# Patient Record
Sex: Female | Born: 1985 | Race: White | Hispanic: No | Marital: Married | Smoking: Never smoker
Health system: Southern US, Community
[De-identification: ages and names within clinical notes are randomized; demographics above are authoritative.]

## PROBLEM LIST (undated history)

## (undated) DIAGNOSIS — F32A Depression, unspecified: Secondary | ICD-10-CM

## (undated) DIAGNOSIS — Q231 Congenital insufficiency of aortic valve: Secondary | ICD-10-CM

## (undated) HISTORY — PX: BICEPS TENDON REPAIR: SHX566

## (undated) HISTORY — PX: ROTATOR CUFF REPAIR: SHX139

---

## 2020-08-29 ENCOUNTER — Observation Stay (HOSPITAL_COMMUNITY)
Admission: EM | Admit: 2020-08-29 | Discharge: 2020-08-30 | Disposition: A | Discharge status: Home / Self Care | Payer: BC Managed Care – PPO | Attending: Internal Medicine | Admitting: Internal Medicine

## 2020-08-29 ENCOUNTER — Encounter (HOSPITAL_COMMUNITY): Payer: Self-pay

## 2020-08-29 ENCOUNTER — Other Ambulatory Visit: Payer: Self-pay

## 2020-08-29 ENCOUNTER — Emergency Department (HOSPITAL_COMMUNITY): Payer: BC Managed Care – PPO

## 2020-08-29 DIAGNOSIS — E039 Hypothyroidism, unspecified: Principal | ICD-10-CM | POA: Diagnosis present

## 2020-08-29 DIAGNOSIS — E669 Obesity, unspecified: Secondary | ICD-10-CM | POA: Diagnosis not present

## 2020-08-29 DIAGNOSIS — R001 Bradycardia, unspecified: Secondary | ICD-10-CM | POA: Diagnosis not present

## 2020-08-29 DIAGNOSIS — Q2381 Bicuspid aortic valve: Secondary | ICD-10-CM

## 2020-08-29 DIAGNOSIS — Z6835 Body mass index (BMI) 35.0-35.9, adult: Secondary | ICD-10-CM | POA: Diagnosis not present

## 2020-08-29 DIAGNOSIS — F32A Depression, unspecified: Secondary | ICD-10-CM | POA: Diagnosis present

## 2020-08-29 DIAGNOSIS — Z20822 Contact with and (suspected) exposure to covid-19: Secondary | ICD-10-CM | POA: Insufficient documentation

## 2020-08-29 DIAGNOSIS — Q231 Congenital insufficiency of aortic valve: Secondary | ICD-10-CM

## 2020-08-29 DIAGNOSIS — R002 Palpitations: Secondary | ICD-10-CM | POA: Diagnosis present

## 2020-08-29 HISTORY — DX: Depression, unspecified: F32.A

## 2020-08-29 HISTORY — DX: Congenital insufficiency of aortic valve: Q23.1

## 2020-08-29 LAB — I-STAT BETA HCG BLOOD, ED (MC, WL, AP ONLY): I-stat hCG, quantitative: <5 m[IU]/mL (ref <5)

## 2020-08-29 LAB — CBC WITH DIFFERENTIAL/PLATELET
Abs Immature Granulocytes: 0.03 10*3/uL (ref 0.00–0.07)
Basophils Absolute: 0 10*3/uL (ref 0.0–0.1)
Basophils Relative: 0 %
Eosinophils Absolute: 0.2 10*3/uL (ref 0.0–0.5)
Eosinophils Relative: 2 %
HCT: 40.3 % (ref 36.0–46.0)
Hemoglobin: 12.8 g/dL (ref 12.0–15.0)
Immature Granulocytes: 0 %
Lymphocytes Relative: 23 %
Lymphs Abs: 2.1 10*3/uL (ref 0.7–4.0)
MCH: 27.2 pg (ref 26.0–34.0)
MCHC: 31.8 g/dL (ref 30.0–36.0)
MCV: 85.6 fL (ref 80.0–100.0)
Monocytes Absolute: 0.7 10*3/uL (ref 0.1–1.0)
Monocytes Relative: 8 %
Neutro Abs: 6.2 10*3/uL (ref 1.7–7.7)
Neutrophils Relative %: 67 %
Platelets: 315 10*3/uL (ref 150–400)
RBC: 4.71 MIL/uL (ref 3.87–5.11)
RDW: 13.2 % (ref 11.5–15.5)
WBC: 9.2 10*3/uL (ref 4.0–10.5)
nRBC: 0 % (ref 0.0–0.2)

## 2020-08-29 LAB — RESP PANEL BY RT-PCR (FLU A&B, COVID) ARPGX2
Influenza A by PCR: NEGATIVE
Influenza B by PCR: NEGATIVE
SARS Coronavirus 2 by RT PCR: NEGATIVE

## 2020-08-29 LAB — BASIC METABOLIC PANEL
Anion gap: 5 (ref 5–15)
BUN: 8 mg/dL (ref 6–20)
CO2: 21 mmol/L — ABNORMAL LOW (ref 22–32)
Calcium: 9 mg/dL (ref 8.9–10.3)
Chloride: 108 mmol/L (ref 98–111)
Creatinine, Ser: 0.8 mg/dL (ref 0.44–1.00)
GFR, Estimated: >60 mL/min (ref >60)
Glucose, Bld: 98 mg/dL (ref 70–99)
Potassium: 3.4 mmol/L — ABNORMAL LOW (ref 3.5–5.1)
Sodium: 134 mmol/L — ABNORMAL LOW (ref 135–145)

## 2020-08-29 LAB — T4, FREE: Free T4: 0.78 ng/dL (ref 0.61–1.12)

## 2020-08-29 LAB — TROPONIN I (HIGH SENSITIVITY)
Troponin I (High Sensitivity): 4 ng/L (ref <18)
Troponin I (High Sensitivity): 3 ng/L (ref <18)

## 2020-08-29 LAB — TSH: TSH: 14.44 u[IU]/mL — ABNORMAL HIGH (ref 0.350–4.500)

## 2020-08-29 MED ORDER — PROMETHAZINE HCL 25 MG PO TABS: 25.0000 mg | ORAL_TABLET | Freq: Four times a day (QID) | ORAL | Status: DC | PRN

## 2020-08-29 MED ORDER — LEVOTHYROXINE SODIUM 25 MCG PO TABS
125.0000 ug | ORAL_TABLET | Freq: Every day | ORAL | Status: DC
  Administered 2020-08-30: 125 ug via ORAL
  Filled 2020-08-29: qty 1

## 2020-08-29 MED ORDER — LEVOTHYROXINE SODIUM 25 MCG PO TABS: 125.0000 ug | ORAL_TABLET | Freq: Every day | ORAL | Status: DC

## 2020-08-29 MED ORDER — FLUTICASONE PROPIONATE 50 MCG/ACT NA SUSP
1.0000 | Freq: Every day | NASAL | Status: DC | PRN
  Filled 2020-08-29: qty 16

## 2020-08-29 MED ORDER — LEVOTHYROXINE SODIUM 25 MCG PO TABS
125.0000 ug | ORAL_TABLET | Freq: Every day | ORAL | Status: DC
  Administered 2020-08-29: 125 ug via ORAL
  Filled 2020-08-29: qty 1

## 2020-08-29 MED ORDER — ENOXAPARIN SODIUM 40 MG/0.4ML IJ SOSY: 40.0000 mg | PREFILLED_SYRINGE | INTRAMUSCULAR | Status: DC

## 2020-08-29 MED ORDER — LACTATED RINGERS IV SOLN: INTRAVENOUS | Status: DC

## 2020-08-29 MED ORDER — TOPIRAMATE 100 MG PO TABS
200.0000 mg | ORAL_TABLET | Freq: Every day | ORAL | Status: DC
  Administered 2020-08-29: 200 mg via ORAL
  Filled 2020-08-29 (×2): qty 2

## 2020-08-29 MED ORDER — TIZANIDINE HCL 4 MG PO TABS
4.0000 mg | ORAL_TABLET | Freq: Three times a day (TID) | ORAL | Status: DC | PRN
  Filled 2020-08-29: qty 1

## 2020-08-29 MED ORDER — SERTRALINE HCL 100 MG PO TABS
200.0000 mg | ORAL_TABLET | Freq: Every day | ORAL | Status: DC
  Administered 2020-08-29: 200 mg via ORAL
  Filled 2020-08-29: qty 2

## 2020-08-29 NOTE — ED Provider Notes (Signed)
I provided a substantive portion of the care of this patient.  I personally performed the entirety of the medical decision making for this encounter.  EKG Interpretation  Date/Time:  Wednesday Aug 29 2020 13:04:28 EDT Ventricular Rate:  64 PR Interval:  164 QRS Duration: 84 QT Interval:  434 QTC Calculation: 447 R Axis:   58 Text Interpretation: Normal sinus rhythm Normal ECG Confirmed by Lorre Nick ([id number]) on 08/29/2020 3:3:79 PM 35 year old female presents with palpitations.  Found to be in sinus bradycardia here.  Also suspicious for hypothyroidism.  Patient has a history of bicuspid valve.  Will consult cardiology   Lorre Nick, MD 08/29/20 [phone]

## 2020-08-29 NOTE — H&P (Addendum)
History and Physical    Bianca Wagner SFK:[id number] DOB: December 08, 1985 DOA: 08/29/2020  PCP: [facility] in [place], [place]  Chief Complaint: Generalized weakness, fatigue, heart palpitations  HPI: Bianca Wagner is a 35 y.o. female with medical history significant of depression, bicuspid aortic valve.  Patient presents from out of town.  The patient lives in [place] with her family.  She travels for her work.  She presented to the urgent care prior to presentation to the emergency department with complaints of heart palpitations since last night.  She checked her blood pressure at home and it was noted to be low and her heart rate was also noted to be low.  She states that she has had fatigue and increasing generalized weakness for the past several weeks.  Occasionally has felt lightheaded.  Denies any chest pain.  No urinary complaints.  No bowel complaints.  At the urgent care her systolic blood pressure was noted to be less than 90.  Her heart rate was also noted to be less than 60.  Here in the emergency department the ED provider did contact the cardiologist on-call, Dr. Herbie Baltimore.  Given the history of bicuspid aortic valve and bradycardia.  Cardiology felt this was likely due to her new onset hypothyroidism.  Did not recommend consult at this time unless clinical status changes or any further cardiac workup at this time.  Labs show sodium of 134, potassium of 3.4, CO2 of 21 and a TSH of 14.  Chest x-ray personally reviewed by me shows no acute cardiopulmonary process.  EKG shows sinus rhythm with a heart rate of 64.  On my evaluation her blood pressure is in the 110s systolic.  Heart rate tween 39 and 50 on bedside monitor upon my evaluation.    Review of Systems: As per HPI; otherwise review of systems reviewed and negative.   Ambulatory Status: Ambulates without assistance  Past Medical History:  Diagnosis Date  . Bicuspid aortic valve   . Depression     Past Surgical  History:  Procedure Laterality Date  . BICEPS TENDON REPAIR    . ROTATOR CUFF REPAIR Right     Social History   Socioeconomic History  . Marital status: Married    Spouse name: Not on file  . Number of children: Not on file  . Years of education: Not on file  . Highest education level: Not on file  Occupational History  . Not on file  Tobacco Use  . Smoking status: Never Smoker  . Smokeless tobacco: Not on file  Substance and Sexual Activity  . Alcohol use: Never  . Drug use: Never  . Sexual activity: Not on file  Other Topics Concern  . Not on file  Social History Narrative  . Not on file   Social Determinants of Health   Financial Resource Strain: Not on file  Food Insecurity: Not on file  Transportation Needs: Not on file  Physical Activity: Not on file  Stress: Not on file  Social Connections: Not on file  Intimate Partner Violence: Not on file    No Known Allergies  Family History  Problem Relation Age of Onset  . Thyroid disease Mother     Prior to Admission medications   Medication Sig Start Date End Date Taking? Authorizing Provider  fluticasone (FLONASE) 50 MCG/ACT nasal spray Place 1 spray into both nostrils daily as needed for allergies or rhinitis.   Yes [provider]  promethazine (PHENERGAN) 25 MG tablet Take 25  mg by mouth 4 (four) times daily as needed for nausea or vomiting. 07/01/20  Yes [provider]  rizatriptan (MAXALT) 10 MG tablet Take 10 mg by mouth daily as needed for migraine. 08/27/20  Yes [provider]  sertraline (ZOLOFT) 100 MG tablet Take 2 tablets by mouth at bedtime. 07/11/20  Yes [provider]  tiZANidine (ZANAFLEX) 4 MG tablet Take 4 mg by mouth 3 (three) times daily as needed (shoulder pain). 08/27/20  Yes [provider]  topiramate (TOPAMAX) 200 MG tablet Take 200 mg by mouth at bedtime. 08/17/20  Yes [provider]    Physical Exam: Vitals:   08/29/20 1745 08/29/20  1800 08/29/20 1815 08/29/20 1820  BP: 113/70 127/86 119/74   Pulse: (!) 45 (!) 42 (!) 40   Resp: 16 15 17    Temp:      SpO2: 100% 100% 100%   Weight:    89.4 kg     . General:  Appears calm and comfortable and is in NAD . Eyes:  PERRL, EOMI, normal lids, iris . ENT:  grossly normal hearing, lips & tongue, mmm; appropriate dentition . Neck:  no LAD, masses or thyromegaly; no carotid bruits . Cardiovascular: irregular rate, regular rhythm, no m/r/g. No LE edema.  Respiratory:   CTA bilaterally with no wheezes/rales/rhonchi.  Normal respiratory effort. . Abdomen:  soft, NT, ND, NABS . Back:   normal alignment, no CVAT . Skin:  no rash or induration seen on limited exam . Musculoskeletal:  grossly normal tone BUE/BLE, good ROM, no bony abnormality . Lower extremity:  No LE edema.  Limited foot exam with no ulcerations.  2+ distal pulses. Marland Kitchen Psychiatric:  grossly normal mood and affect, speech fluent and appropriate, AOx3 . Neurologic:  CN 2-12 grossly intact, moves all extremities in coordinated fashion, sensation intact    Radiological Exams on Admission: Independently reviewed - see discussion in A/P where applicable  DG Chest 2 View  Result Date: 08/29/2020 CLINICAL DATA:  Palpitations.  Weakness. EXAM: CHEST - 2 VIEW COMPARISON:  None. FINDINGS: Heart size is normal. Mediastinal shadows are normal. The lungs are clear. No bronchial thickening. No infiltrate, mass, effusion or collapse. Pulmonary vascularity is normal. No bony abnormality. IMPRESSION: Normal chest Electronically Signed   By: 08/31/2020 M.D.   On: 08/29/2020 13:52    EKG: Independently reviewed.  NSR with rate 64   Labs on Admission: I have personally reviewed the available labs and imaging studies at the time of the admission.  Pertinent labs: TSH 14, sodium 134, potassium 3.4, CO2 21     Assessment:  Symptomatic bradycardia secondary to hypothyroidism New onset hypothyroidism Depression Bicuspid  aortic valve Obesity, BMI 35   Plan: The patient will be admitted to the cardiac telemetry floor under observation status.  Patient will be started on levothyroxine 125 mcg daily.  This is a weight-based dose approximately 1.6 mcg/kg/daily.  Cardiology was consulted by the ED provider and they did not recommend formal consultation at this time.  Her bradycardia is likely associated to her new onset hypothyroidism.  Recommend repeat TSH in 4 to 6 weeks.  We will obtain a free T4 level.  Continue telemetry.  We will start gentle IV fluids with lactated Ringer's at 75 cc an hour.  If clinical status changes can consider cardiology consult at that point.  We will hold off on any atropine at the moment as the patient is not currently hypotensive or altered. Nurse communication order  in place if the patient does become hypotensive to notify physician so that atropine 1mg  IV x1 dose can be potentially administered.   Level of care: Telemetry Cardiac DVT prophylaxis: Lovenox and SCDs Code Status: Full Family Communication: Family present at bedside Disposition Plan:   Home without any needs   MD [facility]   How to contact the [facility] Attending or Consulting provider 7A - 7P or covering provider during after hours 7P -7A, for this patient?  1. Check the care team in [facility] and look for a) attending/consulting [facility] provider listed and b) the [facility] team listed 2. Log into [email or web address] and use [place]'s universal password to access. If you do not have the password, please contact the hospital operator. 3. Locate the [facility] provider you are looking for under [facility] and page to a number that you can be directly reached. 4. If you still have difficulty reaching the provider, please page the [facility] (Director on Call) for the Hospitalists listed on amion for assistance.   08/29/2020, 6:39 PM

## 2020-08-29 NOTE — ED Notes (Signed)
Pt ambulated with steady gait around orange unit. Sats ranged from 92%-96% with HR at 67-77. She reported feeling slightly dizzy but did not need to sit down when asked.

## 2020-08-29 NOTE — ED Notes (Signed)
Report attempted 

## 2020-08-29 NOTE — ED Notes (Signed)
William, PA at bedside. 

## 2020-08-29 NOTE — ED Triage Notes (Signed)
Patient sent from [facility] for further evaluation of palpitations earlier today. Found to have HR less than 60 and BP 90s at [facility]. Patient denies CP, alert and oriented complains of fatigue and just not feeling well.

## 2020-08-29 NOTE — ED Provider Notes (Signed)
Emergency Medicine Provider Triage Evaluation Note  Bianca Wagner , a 35 y.o. female  was evaluated in triage.  Pt complains of palpitations, fatigue, generalized weakness. Seen at urgent care pta and send here for bradycardia. Hx bicuspid aortic valve, here from out of town.   Review of Systems  Positive: Palpitations, fatigue, generalized weakness Negative: Chest pain  Physical Exam  There were no vitals taken for this visit. Gen:   Awake, no distress   Resp:  Normal effort  MSK:   Moves extremities without difficulty  Other:  Sinus brady  Medical Decision Making  Medically screening exam initiated at 1:04 PM.  Appropriate orders placed.  Bianca Wagner was informed that the remainder of the evaluation will be completed by another provider, this initial triage assessment does not replace that evaluation, and the importance of remaining in the ED until their evaluation is complete.   Bianca Wagner 08/29/20 1308    Bianca Lefevre, MD 08/30/20 1725

## 2020-08-29 NOTE — ED Provider Notes (Signed)
[facility] EMERGENCY DEPARTMENT Provider Note   CSN: [id number] Arrival date & time: 08/29/20  1300     History No chief complaint on file.   Bianca Wagner is a 35 y.o. female.  HPI   Patient with significant medical history of aortic bicuspid valve presents to the emergency department with chief complaint of feeling off.  Patient states yesterday she just was not feeling well, states she felt very tired and had heart palpitations which came on suddenly, they lasted throughout the night, she said it took her breath away but she not feel short of breath.  Patient states this morning she had another bout of palpitations which lasted approximately 30 minutes and went away on its own, she denies ever experiencing chest pain, becoming diaphoretic, nausea, vomiting, feeling lightheadedness or dizziness.  Other than the bicuspid valve she has no other cardiac history, no history of PEs or DVTs, currently not on hormone therapy, denies recent trauma, leg swelling, recent surgeries.  Patient does endorse that over the last month or so she has felt more fatigued, tired, has a lack of appetite  denies any sort of skin changes, denies history autoimmune diseases.  Patient states she went to urgent care today where they noted that she was bradycardic and had a systolic blood pressure in the 90s and sent her to the emergency department for further evaluation.  Patient denies headaches, fevers, chills, chest pain, abdominal pain, nausea, vomit, diarrhea, worsening pedal edema.  Past Medical History:  Diagnosis Date  . Bicuspid aortic valve   . Depression     Patient Active Problem List   Diagnosis Date Noted  . Symptomatic bradycardia 08/29/2020  . Hypothyroidism 08/29/2020  . Depression 08/29/2020  . Bicuspid aortic valve 08/29/2020    Past Surgical History:  Procedure Laterality Date  . BICEPS TENDON REPAIR    . ROTATOR CUFF REPAIR Right      OB History   No obstetric  history on file.     Family History  Problem Relation Age of Onset  . Thyroid disease Mother     Social History   Tobacco Use  . Smoking status: Never Smoker  Substance Use Topics  . Alcohol use: Never  . Drug use: Never    Home Medications Prior to Admission medications   Medication Sig Start Date End Date Taking? Authorizing Provider  fluticasone (FLONASE) 50 MCG/ACT nasal spray Place 1 spray into both nostrils daily as needed for allergies or rhinitis.   Yes [provider]  promethazine (PHENERGAN) 25 MG tablet Take 25 mg by mouth 4 (four) times daily as needed for nausea or vomiting. 07/01/20  Yes [provider]  rizatriptan (MAXALT) 10 MG tablet Take 10 mg by mouth daily as needed for migraine. 08/27/20  Yes [provider]  sertraline (ZOLOFT) 100 MG tablet Take 2 tablets by mouth at bedtime. 07/11/20  Yes [provider]  tiZANidine (ZANAFLEX) 4 MG tablet Take 4 mg by mouth 3 (three) times daily as needed (shoulder pain). 08/27/20  Yes [provider]  topiramate (TOPAMAX) 200 MG tablet Take 200 mg by mouth at bedtime. 08/17/20  Yes [provider]    Allergies    Patient has no known allergies.  Review of Systems   Review of Systems  Constitutional: Positive for appetite change and fatigue. Negative for chills and fever.  HENT: Negative for congestion and sore throat.   Respiratory: Negative for shortness of breath.   Cardiovascular: Positive for  palpitations. Negative for chest pain.  Gastrointestinal: Negative for abdominal pain, diarrhea, nausea and vomiting.  Genitourinary: Negative for enuresis.  Musculoskeletal: Negative for back pain.  Skin: Negative for rash.  Neurological: Negative for dizziness and headaches.  Hematological: Does not bruise/bleed easily.    Physical Exam Updated Vital Signs BP 119/74   Pulse (!) 40   Temp 98 F (36.7 C)   Resp 17   Wt 89.4 kg   LMP 08/22/2020   SpO2 100%    Physical Exam Vitals and nursing note reviewed.  Constitutional:      General: She is not in acute distress.    Appearance: She is not ill-appearing.  HENT:     Head: Normocephalic and atraumatic.     Nose: No congestion.  Eyes:     Conjunctiva/sclera: Conjunctivae normal.  Neck:     Comments: Throat was palpated no palpable nodules or enlarged thyroid present my exam. Cardiovascular:     Rate and Rhythm: Regular rhythm. Bradycardia present.     Pulses: Normal pulses.     Heart sounds: No murmur heard. No friction rub. No gallop.   Pulmonary:     Effort: No respiratory distress.     Breath sounds: No wheezing, rhonchi or rales.  Abdominal:     Palpations: Abdomen is soft.     Tenderness: There is no abdominal tenderness.  Musculoskeletal:     Cervical back: No rigidity or tenderness.     Right lower leg: No edema.     Left lower leg: No edema.  Skin:    General: Skin is warm and dry.  Neurological:     Mental Status: She is alert.  Psychiatric:        Mood and Affect: Mood normal.     ED Results / Procedures / Treatments   Labs (all labs ordered are listed, but only abnormal results are displayed) Labs Reviewed  BASIC METABOLIC PANEL - Abnormal; Notable for the following components:      Result Value   Sodium 134 (*)    Potassium 3.4 (*)    CO2 21 (*)    All other components within normal limits  TSH - Abnormal; Notable for the following components:   TSH 14.440 (*)    All other components within normal limits  RESP PANEL BY RT-PCR (FLU A&B, COVID) ARPGX2  CBC WITH DIFFERENTIAL/PLATELET  HIV ANTIBODY (ROUTINE TESTING W REFLEX)  COMPREHENSIVE METABOLIC PANEL  CBC  I-STAT BETA HCG BLOOD, ED ([facility], [facility], [facility] ONLY)  TROPONIN I (HIGH SENSITIVITY)  TROPONIN I (HIGH SENSITIVITY)    EKG EKG Interpretation  Date/Time:  Wednesday Aug 29 2020 13:04:28 EDT Ventricular Rate:  64 PR Interval:  164 QRS Duration: 84 QT Interval:  434 QTC Calculation: 447 R  Axis:   58 Text Interpretation: Normal sinus rhythm Normal ECG Confirmed by Lorre Nick ([id number]) on 08/29/2020 3:19:24 PM   Radiology DG Chest 2 View  Result Date: 08/29/2020 CLINICAL DATA:  Palpitations.  Weakness. EXAM: CHEST - 2 VIEW COMPARISON:  None. FINDINGS: Heart size is normal. Mediastinal shadows are normal. The lungs are clear. No bronchial thickening. No infiltrate, mass, effusion or collapse. Pulmonary vascularity is normal. No bony abnormality. IMPRESSION: Normal chest Electronically Signed   By: Paulina Fusi M.D.   On: 08/29/2020 13:52    Procedures Procedures   Medications Ordered in ED Medications  sertraline (ZOLOFT) tablet 200 mg (has no administration in time range)  tiZANidine (ZANAFLEX) tablet 4 mg (has no administration in  time range)  topiramate (TOPAMAX) tablet 200 mg (has no administration in time range)  fluticasone (FLONASE) 50 MCG/ACT nasal spray 1 spray (has no administration in time range)  promethazine (PHENERGAN) tablet 25 mg (has no administration in time range)  enoxaparin (LOVENOX) injection 40 mg (has no administration in time range)  levothyroxine (SYNTHROID) tablet 125 mcg (has no administration in time range)    ED Course  I have reviewed the triage vital signs and the nursing notes.  Pertinent labs & imaging results that were available during my care of the patient were reviewed by me and considered in my medical decision making (see chart for details).    MDM Rules/Calculators/A&P                         Initial impression-patient presents with not feeling well.  She is alert, does not appear to be in acute distress, vital signs reassuring.  Triage obtained lab work-up.  Work-up-CBC unremarkable, BMP shows slight hyponatremia 134, slight hypokalemia 3.4, slight decrease in CO2 of 21, hCG less than 5, first troponin is 4, TSH 14.4, chest x-ray negative for acute findings, EKG sinus without signs of ischemia.  Orthostatics negative, patient  was ambulated heart rate remained in the 60s to 70s no drop in O2 sats.  Consult    1. due to history of bicuspid aortic valve will consult with cardiology for further recommendations.  Spoke with Dr. Herbie Baltimore of cardiology, he feels the bradycardia is secondary due to the hypothyroidism and from a cardiac standpoint they have no further work-up.  He recommends speaking with endocrinology.  2. Spoke with Dr. Linna Darner of the hospitalist team, he recommends admission for further evaluation.  Reassessment-updated patient on recommendations from cardiology, explained to her that her heart rate is abnormally low and this could be potentially life-threatening.  Recommend that she be admitted for further evaluation.  Patient was agreeable to this plan.  Will speak with hospitalist team for further recommendations.  Rule out-low suspicion for ACS as patient denies chest pain, patient has no cardiac history, EKG is negative for ischemia, patient has a negative delta troponin.  Low suspicion for heart block or arrhythmias as EKG is sinus bradycardia without any other abnormalities.  Low suspicion for systemic infection as patient is nontoxic-appearing, vital signs reassuring, no obvious source of infection present on exam or within lab work.  Plan-admit to medicine due to bradycardia secondary due to hypothyroidism, patient care will be transferred to admitting team.   Final Clinical Impression(s) / ED Diagnoses Final diagnoses:  Hypothyroidism, unspecified type  Bradycardia    Rx / DC Orders ED Discharge Orders    None       Carroll Sage, PA-C 08/29/20 1840    Lorre Nick, MD 08/31/20 1005

## 2020-08-30 ENCOUNTER — Encounter (HOSPITAL_COMMUNITY): Payer: Self-pay | Admitting: Family Medicine

## 2020-08-30 DIAGNOSIS — R001 Bradycardia, unspecified: Secondary | ICD-10-CM | POA: Diagnosis not present

## 2020-08-30 LAB — COMPREHENSIVE METABOLIC PANEL
ALT: 23 U/L (ref 0–44)
AST: 20 U/L (ref 15–41)
Albumin: 3.8 g/dL (ref 3.5–5.0)
Alkaline Phosphatase: 54 U/L (ref 38–126)
Anion gap: 7 (ref 5–15)
BUN: 7 mg/dL (ref 6–20)
CO2: 21 mmol/L — ABNORMAL LOW (ref 22–32)
Calcium: 8.9 mg/dL (ref 8.9–10.3)
Chloride: 111 mmol/L (ref 98–111)
Creatinine, Ser: 0.8 mg/dL (ref 0.44–1.00)
GFR, Estimated: >60 mL/min (ref >60)
Glucose, Bld: 84 mg/dL (ref 70–99)
Potassium: 3.7 mmol/L (ref 3.5–5.1)
Sodium: 139 mmol/L (ref 135–145)
Total Bilirubin: 0.8 mg/dL (ref 0.3–1.2)
Total Protein: 6.5 g/dL (ref 6.5–8.1)

## 2020-08-30 LAB — CBC
HCT: 39.4 % (ref 36.0–46.0)
Hemoglobin: 12.5 g/dL (ref 12.0–15.0)
MCH: 26.8 pg (ref 26.0–34.0)
MCHC: 31.7 g/dL (ref 30.0–36.0)
MCV: 84.5 fL (ref 80.0–100.0)
Platelets: 268 10*3/uL (ref 150–400)
RBC: 4.66 MIL/uL (ref 3.87–5.11)
RDW: 13.2 % (ref 11.5–15.5)
WBC: 6.9 10*3/uL (ref 4.0–10.5)
nRBC: 0 % (ref 0.0–0.2)

## 2020-08-30 LAB — HIV ANTIBODY (ROUTINE TESTING W REFLEX): HIV Screen 4th Generation wRfx: NONREACTIVE

## 2020-08-30 MED ORDER — LEVOTHYROXINE SODIUM 125 MCG PO TABS: 125.0000 ug | ORAL_TABLET | Freq: Every day | ORAL | 0 refills | Status: AC

## 2020-08-30 NOTE — Progress Notes (Signed)
   08/29/20 2330  Assess: if the MEWS score is Yellow or Red  Were vital signs taken at a resting state? Yes  Focused Assessment Change from prior assessment (see assessment flowsheet)  Early Detection of Sepsis Score *See Row Information* Low  MEWS guidelines implemented *See Row Information* Yes  Treat  Pain Scale 0-10  Pain Score 0  Take Vital Signs  Increase Vital Sign Frequency  Yellow: Q 2hr X 2 then Q 4hr X 2, if remains yellow, continue Q 4hrs  Escalate  MEWS: Escalate Yellow: discuss with charge nurse/RN and consider discussing with provider and RRT  Notify: Charge Nurse/RN  Name of Charge Nurse/RN Notified Dana, RN  Date Charge Nurse/RN Notified 08/29/20  Time Charge Nurse/RN Notified 2330  Document  Patient Outcome Other (Comment) (Patient stablized)  Progress note created (see row info) Yes

## 2020-08-30 NOTE — Discharge Instructions (Signed)
Hypothyroidism  Hypothyroidism is when the thyroid gland does not make enough of certain hormones (it is underactive). The thyroid gland is a small gland located in the lower front part of the neck, just in front of the windpipe (trachea). This gland makes hormones that help control how the body uses food for energy (metabolism) as well as how the heart and brain function. These hormones also play a role in keeping your bones strong. When the thyroid is underactive, it produces too little of the hormones thyroxine (T4) and triiodothyronine (T3). What are the causes? This condition may be caused by:  Hashimoto's disease. This is a disease in which the body's disease-fighting system (immune system) attacks the thyroid gland. This is the most common cause.  Viral infections.  Pregnancy.  Certain medicines.  Birth defects.  Past radiation treatments to the head or neck for cancer.  Past treatment with radioactive iodine.  Past exposure to radiation in the environment.  Past surgical removal of part or all of the thyroid.  Problems with a gland in the center of the brain (pituitary gland).  Lack of enough iodine in the diet. What increases the risk? You are more likely to develop this condition if:  You are female.  You have a family history of thyroid conditions.  You use a medicine called lithium.  You take medicines that affect the immune system (immunosuppressants). What are the signs or symptoms? Symptoms of this condition include:  Feeling as though you have no energy (lethargy).  Not being able to tolerate cold.  Weight gain that is not explained by a change in diet or exercise habits.  Lack of appetite.  Dry skin.  Coarse hair.  Menstrual irregularity.  Slowing of thought processes.  Constipation.  Sadness or depression. How is this diagnosed? This condition may be diagnosed based on:  Your symptoms, your medical history, and a physical exam.  Blood  tests. You may also have imaging tests, such as an ultrasound or MRI. How is this treated? This condition is treated with medicine that replaces the thyroid hormones that your body does not make. After you begin treatment, it may take several weeks for symptoms to go away. Follow these instructions at home:  Take over-the-counter and prescription medicines only as told by your health care provider.  If you start taking any new medicines, tell your health care provider.  Keep all follow-up visits as told by your health care provider. This is important. ? As your condition improves, your dosage of thyroid hormone medicine may change. ? You will need to have blood tests regularly so that your health care provider can monitor your condition. Contact a health care provider if:  Your symptoms do not get better with treatment.  You are taking thyroid hormone replacement medicine and you: ? Sweat a lot. ? Have tremors. ? Feel anxious. ? Lose weight rapidly. ? Cannot tolerate heat. ? Have emotional swings. ? Have diarrhea. ? Feel weak. Get help right away if you have:  Chest pain.  An irregular heartbeat.  A rapid heartbeat.  Difficulty breathing. Summary  Hypothyroidism is when the thyroid gland does not make enough of certain hormones (it is underactive).  When the thyroid is underactive, it produces too little of the hormones thyroxine (T4) and triiodothyronine (T3).  The most common cause is Hashimoto's disease, a disease in which the body's disease-fighting system (immune system) attacks the thyroid gland. The condition can also be caused by viral infections, medicine, pregnancy, or   past radiation treatment to the head or neck.  Symptoms may include weight gain, dry skin, constipation, feeling as though you do not have energy, and not being able to tolerate cold.  This condition is treated with medicine to replace the thyroid hormones that your body does not make. This  information is not intended to replace advice given to you by your health care provider. Make sure you discuss any questions you have with your health care provider. Document Revised: 12/23/2019 Document Reviewed: 12/08/2019 Elsevier Patient Education  2021 Elsevier Inc.  

## 2020-08-30 NOTE — Plan of Care (Signed)
  Problem: Education: Goal: Knowledge of General Education information will improve Description: Including pain rating scale, medication(s)/side effects and non-pharmacologic comfort measures 08/30/2020 1036 by Coy Saunas, RN Outcome: Adequate for Discharge 08/30/2020 1036 by Coy Saunas, RN Outcome: Adequate for Discharge   Problem: Health Behavior/Discharge Planning: Goal: Ability to manage health-related needs will improve 08/30/2020 1036 by Coy Saunas, RN Outcome: Adequate for Discharge 08/30/2020 1036 by Coy Saunas, RN Outcome: Adequate for Discharge   Problem: Clinical Measurements: Goal: Ability to maintain clinical measurements within normal limits will improve 08/30/2020 1036 by Coy Saunas, RN Outcome: Adequate for Discharge 08/30/2020 1036 by Coy Saunas, RN Outcome: Adequate for Discharge Goal: Will remain free from infection 08/30/2020 1036 by Coy Saunas, RN Outcome: Adequate for Discharge 08/30/2020 1036 by Coy Saunas, RN Outcome: Adequate for Discharge Goal: Diagnostic test results will improve 08/30/2020 1036 by Coy Saunas, RN Outcome: Adequate for Discharge 08/30/2020 1036 by Coy Saunas, RN Outcome: Adequate for Discharge Goal: Respiratory complications will improve 08/30/2020 1036 by Coy Saunas, RN Outcome: Adequate for Discharge 08/30/2020 1036 by Coy Saunas, RN Outcome: Adequate for Discharge Goal: Cardiovascular complication will be avoided 08/30/2020 1036 by Coy Saunas, RN Outcome: Adequate for Discharge 08/30/2020 1036 by Coy Saunas, RN Outcome: Adequate for Discharge   Problem: Activity: Goal: Risk for activity intolerance will decrease 08/30/2020 1036 by Coy Saunas, RN Outcome: Adequate for Discharge 08/30/2020 1036 by Coy Saunas, RN Outcome: Adequate for Discharge   Problem: Nutrition: Goal: Adequate nutrition will be maintained 08/30/2020 1036 by Coy Saunas, RN Outcome: Adequate for  Discharge 08/30/2020 1036 by Coy Saunas, RN Outcome: Adequate for Discharge   Problem: Coping: Goal: Level of anxiety will decrease 08/30/2020 1036 by Coy Saunas, RN Outcome: Adequate for Discharge 08/30/2020 1036 by Coy Saunas, RN Outcome: Adequate for Discharge   Problem: Elimination: Goal: Will not experience complications related to bowel motility 08/30/2020 1036 by Coy Saunas, RN Outcome: Adequate for Discharge 08/30/2020 1036 by Coy Saunas, RN Outcome: Adequate for Discharge Goal: Will not experience complications related to urinary retention 08/30/2020 1036 by Coy Saunas, RN Outcome: Adequate for Discharge 08/30/2020 1036 by Coy Saunas, RN Outcome: Adequate for Discharge   Problem: Pain Managment: Goal: General experience of comfort will improve 08/30/2020 1036 by Coy Saunas, RN Outcome: Adequate for Discharge 08/30/2020 1036 by Coy Saunas, RN Outcome: Adequate for Discharge   Problem: Safety: Goal: Ability to remain free from injury will improve 08/30/2020 1036 by Coy Saunas, RN Outcome: Adequate for Discharge 08/30/2020 1036 by Coy Saunas, RN Outcome: Adequate for Discharge   Problem: Skin Integrity: Goal: Risk for impaired skin integrity will decrease 08/30/2020 1036 by Coy Saunas, RN Outcome: Adequate for Discharge 08/30/2020 1036 by Coy Saunas, RN Outcome: Adequate for Discharge

## 2020-08-30 NOTE — Discharge Summary (Signed)
Physician Discharge Summary  Bianca Wagner YNW:[id number] DOB: 11/13/1985 DOA: 08/29/2020  PCP: Pcp, No  Admit date: 08/29/2020 Discharge date: 08/30/2020  Admitted From: Home Disposition: Home  Recommendations for Outpatient Follow-up:  1. Follow up with PCP in 4-6 weeks for repeat thyroid function tests 2. Started on levothyroxine 125 mcg p.o. daily for subclinical hypothyroidism  Home Health: No Equipment/Devices: None  Discharge Condition: Stable CODE STATUS: Full code Diet recommendation: Regular diet  History of present illness:  Bianca AlmJessica Wagner is a 35 year old female with past medical history significant for depression, bicuspid aortic valve who initially presented to urgent care for heart palpitations, weakness, fatigue and lightheadedness.  Onset of symptoms over the past several weeks.  During urgent care evaluation, patient was noted to have a heart rate less than 60 with SBP less than 90 and was sent to the emergency department for further evaluation.  Patient denied any chest pain.  No recent initiation of medications, not on a beta-blocker.  Has been on the same depression/migraine medications for many years.  In the ED, temperature 98.0 F, HR 76 (low of 39), RR 18, BP 103/72, SPO2 100% on room air.  Sodium 134, potassium 3.4, chloride 108, CO2 21, glucose 98, BUN 8, creatinine 0.80.  Troponin 4>3.  WBC 9.2, hemoglobin 12.8, platelets 315.  TSH 14.440.  Free T4 0.78.  Influenza A/B PCR negative.  Cova-19 PCR negative.  Chest x-ray with no active cardiopulmonary disease process.  hCG negative.  EKG with sinus bradycardia, rate 64, QTc 447, no dynamic changes or prolonged intervals appreciated.  Case was discussed with cardiology on-call, Dr. Herbie BaltimoreHarding; cardiology felt that her symptoms were related to her new onset hypothyroidism and did not recommend any further cardiac work-up at this time.  [facility] consulted for further evaluation and management of new onset  hypothyroidism.   Hospital course:  Subclinical hypothyroidism Symptomatic bradycardia secondary to hypothyroidism Patient presenting to the ED with progressive weakness, palpitations, fatigue and lightheadedness over the last several weeks.  Patient was noted to have a low heart rate at urgent care.  Patient was noted to have a elevated TSH of 14.440 with a borderline low free T4 of 0.78.  Case was discussed with cardiology, Dr. Herbie BaltimoreHarding by EDP who did not recommend any further cardiac testing/evaluation as her symptoms related to hypothyroidism.  Patient is not on a beta-blocker outpatient.  Patient is on Topamax and sertraline outpatient, which she has been on for many years without any symptoms.  Patient was started on levothyroxine 125 mcg p.o. daily.  Will need repeat TFTs in 4-6 weeks.  Depression Migraine headaches Continue sertraline and Topamax  Bicuspid aortic valve Patient needs to establish care with cardiology in her hometown of [place].  Obesity Body mass index is 35.38 kg/m.  Discussed with patient needs for aggressive lifestyle changes/weight loss as this complicates all facets of care.  Outpatient follow-up with PCP.     Discharge Diagnoses:  Active Problems:   Hypothyroidism   Depression   Bicuspid aortic valve    Discharge Instructions  Discharge Instructions    Call MD for:  difficulty breathing, headache or visual disturbances   Complete by: As directed    Call MD for:  extreme fatigue   Complete by: As directed    Call MD for:  persistant dizziness or light-headedness   Complete by: As directed    Call MD for:  persistant nausea and vomiting   Complete by: As directed    Call MD for:  severe uncontrolled pain   Complete by: As directed    Call MD for:  temperature >100.4   Complete by: As directed    Diet - low sodium heart healthy   Complete by: As directed    Increase activity slowly   Complete by: As directed      Allergies as of 08/30/2020    No Known Allergies     Medication List    TAKE these medications   fluticasone 50 MCG/ACT nasal spray Commonly known as: FLONASE Place 1 spray into both nostrils daily as needed for allergies or rhinitis.   levothyroxine 125 MCG tablet Commonly known as: SYNTHROID Take 1 tablet (125 mcg total) by mouth daily at 6 (six) AM. Start taking on: Aug 31, 2020   promethazine 25 MG tablet Commonly known as: PHENERGAN Take 25 mg by mouth 4 (four) times daily as needed for nausea or vomiting.   rizatriptan 10 MG tablet Commonly known as: MAXALT Take 10 mg by mouth daily as needed for migraine.   sertraline 100 MG tablet Commonly known as: ZOLOFT Take 2 tablets by mouth at bedtime.   tiZANidine 4 MG tablet Commonly known as: ZANAFLEX Take 4 mg by mouth 3 (three) times daily as needed (shoulder pain).   topiramate 200 MG tablet Commonly known as: TOPAMAX Take 200 mg by mouth at bedtime.       Follow-up Information    Primary care physician. Schedule an appointment as soon as possible for a visit in 4 week(s).   Why: Needs appointment in 4-6 weeks for repeat thyroid function test             No Known Allergies  Consultations:  EDP discussed case with cardiology on-call, Dr. Herbie Baltimore   Procedures/Studies: DG Chest 2 View  Result Date: 08/29/2020 CLINICAL DATA:  Palpitations.  Weakness. EXAM: CHEST - 2 VIEW COMPARISON:  None. FINDINGS: Heart size is normal. Mediastinal shadows are normal. The lungs are clear. No bronchial thickening. No infiltrate, mass, effusion or collapse. Pulmonary vascularity is normal. No bony abnormality. IMPRESSION: Normal chest Electronically Signed   By: Paulina Fusi M.D.   On: 08/29/2020 13:52      Subjective: Patient seen and examined at bedside, resting comfortably.  Fatigue slightly improved today.  Heart rate has now normalized.  Started on levothyroxine overnight.  No other questions or concerns at this time.  Ready for discharge home.   Discussed need to establish care with a cardiologist outpatient in regards to her bicuspid aortic valve as well as follow-up PCP in 4-6 weeks for repeat TFTs.  Specifically denies headache, no dizziness, no chest pain, no palpitations currently, no shortness of breath, no abdominal pain, no fever/chills/night sweats, no nausea/vomiting/diarrhea.  No acute events overnight per nursing staff.  Discharge Exam: Vitals:   08/30/20 0238 08/30/20 0801  BP: 110/67 126/90  Pulse: 69 63  Resp:  16  Temp: 97.8 F (36.6 C) 99.2 F (37.3 C)  SpO2: 100%    Vitals:   08/29/20 2320 08/30/20 0100 08/30/20 0238 08/30/20 0801  BP: 100/60 110/73 110/67 126/90  Pulse: (!) 41 60 69 63  Resp:    16  Temp: 98 F (36.7 C) 97.7 F (36.5 C) 97.8 F (36.6 C) 99.2 F (37.3 C)  TempSrc: Oral Oral Oral Oral  SpO2: 97% 98% 100%   Weight:      Height:        General: Pt is alert, awake, not in acute distress Cardiovascular: RRR, S1/S2 +,  no rubs, no gallops Respiratory: CTA bilaterally, no wheezing, no rhonchi Abdominal: Soft, NT, ND, bowel sounds + Extremities: no edema, no cyanosis    The results of significant diagnostics from this hospitalization (including imaging, microbiology, ancillary and laboratory) are listed below for reference.     Microbiology: Recent Results (from the past 240 hour(s))  Resp Panel by RT-PCR (Flu A&B, Covid) Nasopharyngeal Swab     Status: None   Collection Time: 08/29/20  6:44 PM   Specimen: Nasopharyngeal Swab; Nasopharyngeal(NP) swabs in vial transport medium  Result Value Ref Range Status   SARS Coronavirus 2 by RT PCR NEGATIVE NEGATIVE Final    Comment: (NOTE) SARS-CoV-2 target nucleic acids are NOT DETECTED.  The SARS-CoV-2 RNA is generally detectable in upper respiratory specimens during the acute phase of infection. The lowest concentration of SARS-CoV-2 viral copies this assay can detect is 138 copies/mL. A negative result does not preclude  SARS-Cov-2 infection and should not be used as the sole basis for treatment or other patient management decisions. A negative result may occur with  improper specimen collection/handling, submission of specimen other than nasopharyngeal swab, presence of viral mutation(s) within the areas targeted by this assay, and inadequate number of viral copies(<138 copies/mL). A negative result must be combined with clinical observations, patient history, and epidemiological information. The expected result is Negative.  Fact Sheet for Patients:  BloggerCourse.com  Fact Sheet for Healthcare Providers:  SeriousBroker.it  This test is no t yet approved or cleared by the [place] FDA and  has been authorized for detection and/or diagnosis of SARS-CoV-2 by FDA under an Emergency Use Authorization (EUA). This EUA will remain  in effect (meaning this test can be used) for the duration of the COVID-19 declaration under Section 564(b)(1) of the Act, 21 U.S.C.section 360bbb-3(b)(1), unless the authorization is terminated  or revoked sooner.       Influenza A by PCR NEGATIVE NEGATIVE Final   Influenza B by PCR NEGATIVE NEGATIVE Final    Comment: (NOTE) The Xpert Xpress SARS-CoV-2/FLU/RSV plus assay is intended as an aid in the diagnosis of influenza from Nasopharyngeal swab specimens and should not be used as a sole basis for treatment. Nasal washings and aspirates are unacceptable for Xpert Xpress SARS-CoV-2/FLU/RSV testing.  Fact Sheet for Patients: BloggerCourse.com  Fact Sheet for Healthcare Providers: SeriousBroker.it  This test is not yet approved or cleared by the [place] FDA and has been authorized for detection and/or diagnosis of SARS-CoV-2 by FDA under an Emergency Use Authorization (EUA). This EUA will remain in effect (meaning this test can be used) for the duration of  the COVID-19 declaration under Section 564(b)(1) of the Act, 21 U.S.C. section 360bbb-3(b)(1), unless the authorization is terminated or revoked.  Performed at [facility], [address] [place], [place] [address]      Labs: BNP (last 3 results) No results for input(s): BNP in the last 8760 hours. Basic Metabolic Panel: Recent Labs  Lab 08/29/20 1316 08/30/20 0437  NA 134* 139  K 3.4* 3.7  CL 108 111  CO2 21* 21*  GLUCOSE 98 84  BUN 8 7  CREATININE 0.80 0.80  CALCIUM 9.0 8.9   Liver Function Tests: Recent Labs  Lab 08/30/20 0437  AST 20  ALT 23  ALKPHOS 54  BILITOT 0.8  PROT 6.5  ALBUMIN 3.8   No results for input(s): LIPASE, AMYLASE in the last 168 hours. No results for input(s): AMMONIA in the last 168 hours. CBC: Recent  Labs  Lab 08/29/20 1316 08/30/20 0437  WBC 9.2 6.9  NEUTROABS 6.2  --   HGB 12.8 12.5  HCT 40.3 39.4  MCV 85.6 84.5  PLT 315 268   Cardiac Enzymes: No results for input(s): CKTOTAL, CKMB, CKMBINDEX, TROPONINI in the last 168 hours. BNP: Invalid input(s): POCBNP CBG: No results for input(s): GLUCAP in the last 168 hours. D-Dimer No results for input(s): DDIMER in the last 72 hours. Hgb A1c No results for input(s): HGBA1C in the last 72 hours. Lipid Profile No results for input(s): CHOL, HDL, LDLCALC, TRIG, CHOLHDL, LDLDIRECT in the last 72 hours. Thyroid function studies Recent Labs    08/29/20 1316  TSH 14.440*   Anemia work up No results for input(s): VITAMINB12, FOLATE, FERRITIN, TIBC, IRON, RETICCTPCT in the last 72 hours. Urinalysis No results found for: COLORURINE, APPEARANCEUR, LABSPEC, PHURINE, GLUCOSEU, HGBUR, BILIRUBINUR, KETONESUR, PROTEINUR, UROBILINOGEN, NITRITE, LEUKOCYTESUR Sepsis Labs Invalid input(s): PROCALCITONIN,  WBC,  LACTICIDVEN Microbiology Recent Results (from the past 240 hour(s))  Resp Panel by RT-PCR (Flu A&B, Covid) Nasopharyngeal Swab     Status: None   Collection Time: 08/29/20   6:44 PM   Specimen: Nasopharyngeal Swab; Nasopharyngeal(NP) swabs in vial transport medium  Result Value Ref Range Status   SARS Coronavirus 2 by RT PCR NEGATIVE NEGATIVE Final    Comment: (NOTE) SARS-CoV-2 target nucleic acids are NOT DETECTED.  The SARS-CoV-2 RNA is generally detectable in upper respiratory specimens during the acute phase of infection. The lowest concentration of SARS-CoV-2 viral copies this assay can detect is 138 copies/mL. A negative result does not preclude SARS-Cov-2 infection and should not be used as the sole basis for treatment or other patient management decisions. A negative result may occur with  improper specimen collection/handling, submission of specimen other than nasopharyngeal swab, presence of viral mutation(s) within the areas targeted by this assay, and inadequate number of viral copies(<138 copies/mL). A negative result must be combined with clinical observations, patient history, and epidemiological information. The expected result is Negative.  Fact Sheet for Patients:  BloggerCourse.com  Fact Sheet for Healthcare Providers:  SeriousBroker.it  This test is no t yet approved or cleared by the [place] FDA and  has been authorized for detection and/or diagnosis of SARS-CoV-2 by FDA under an Emergency Use Authorization (EUA). This EUA will remain  in effect (meaning this test can be used) for the duration of the COVID-19 declaration under Section 564(b)(1) of the Act, 21 U.S.C.section 360bbb-3(b)(1), unless the authorization is terminated  or revoked sooner.       Influenza A by PCR NEGATIVE NEGATIVE Final   Influenza B by PCR NEGATIVE NEGATIVE Final    Comment: (NOTE) The Xpert Xpress SARS-CoV-2/FLU/RSV plus assay is intended as an aid in the diagnosis of influenza from Nasopharyngeal swab specimens and should not be used as a sole basis for treatment. Nasal washings and aspirates  are unacceptable for Xpert Xpress SARS-CoV-2/FLU/RSV testing.  Fact Sheet for Patients: BloggerCourse.com  Fact Sheet for Healthcare Providers: SeriousBroker.it  This test is not yet approved or cleared by the [place] FDA and has been authorized for detection and/or diagnosis of SARS-CoV-2 by FDA under an Emergency Use Authorization (EUA). This EUA will remain in effect (meaning this test can be used) for the duration of the COVID-19 declaration under Section 564(b)(1) of the Act, 21 U.S.C. section 360bbb-3(b)(1), unless the authorization is terminated or revoked.  Performed at [facility], [address] [place], [place] [address]  Time coordinating discharge: Over 30 minutes  SIGNED:   Alvira Philips [place], DO  [facility] 08/30/2020, 10:24 AM

## 2020-08-30 NOTE — Progress Notes (Signed)
Patient given discharge instructions and stated understanding. 

## 2022-05-24 IMAGING — DX DG CHEST 2V
2 series · 2 of 2 positions shown · non-contrast
Comparison: None.

CLINICAL DATA: Palpitations.  Weakness.

EXAM:
CHEST - 2 VIEW

[w chest pa]
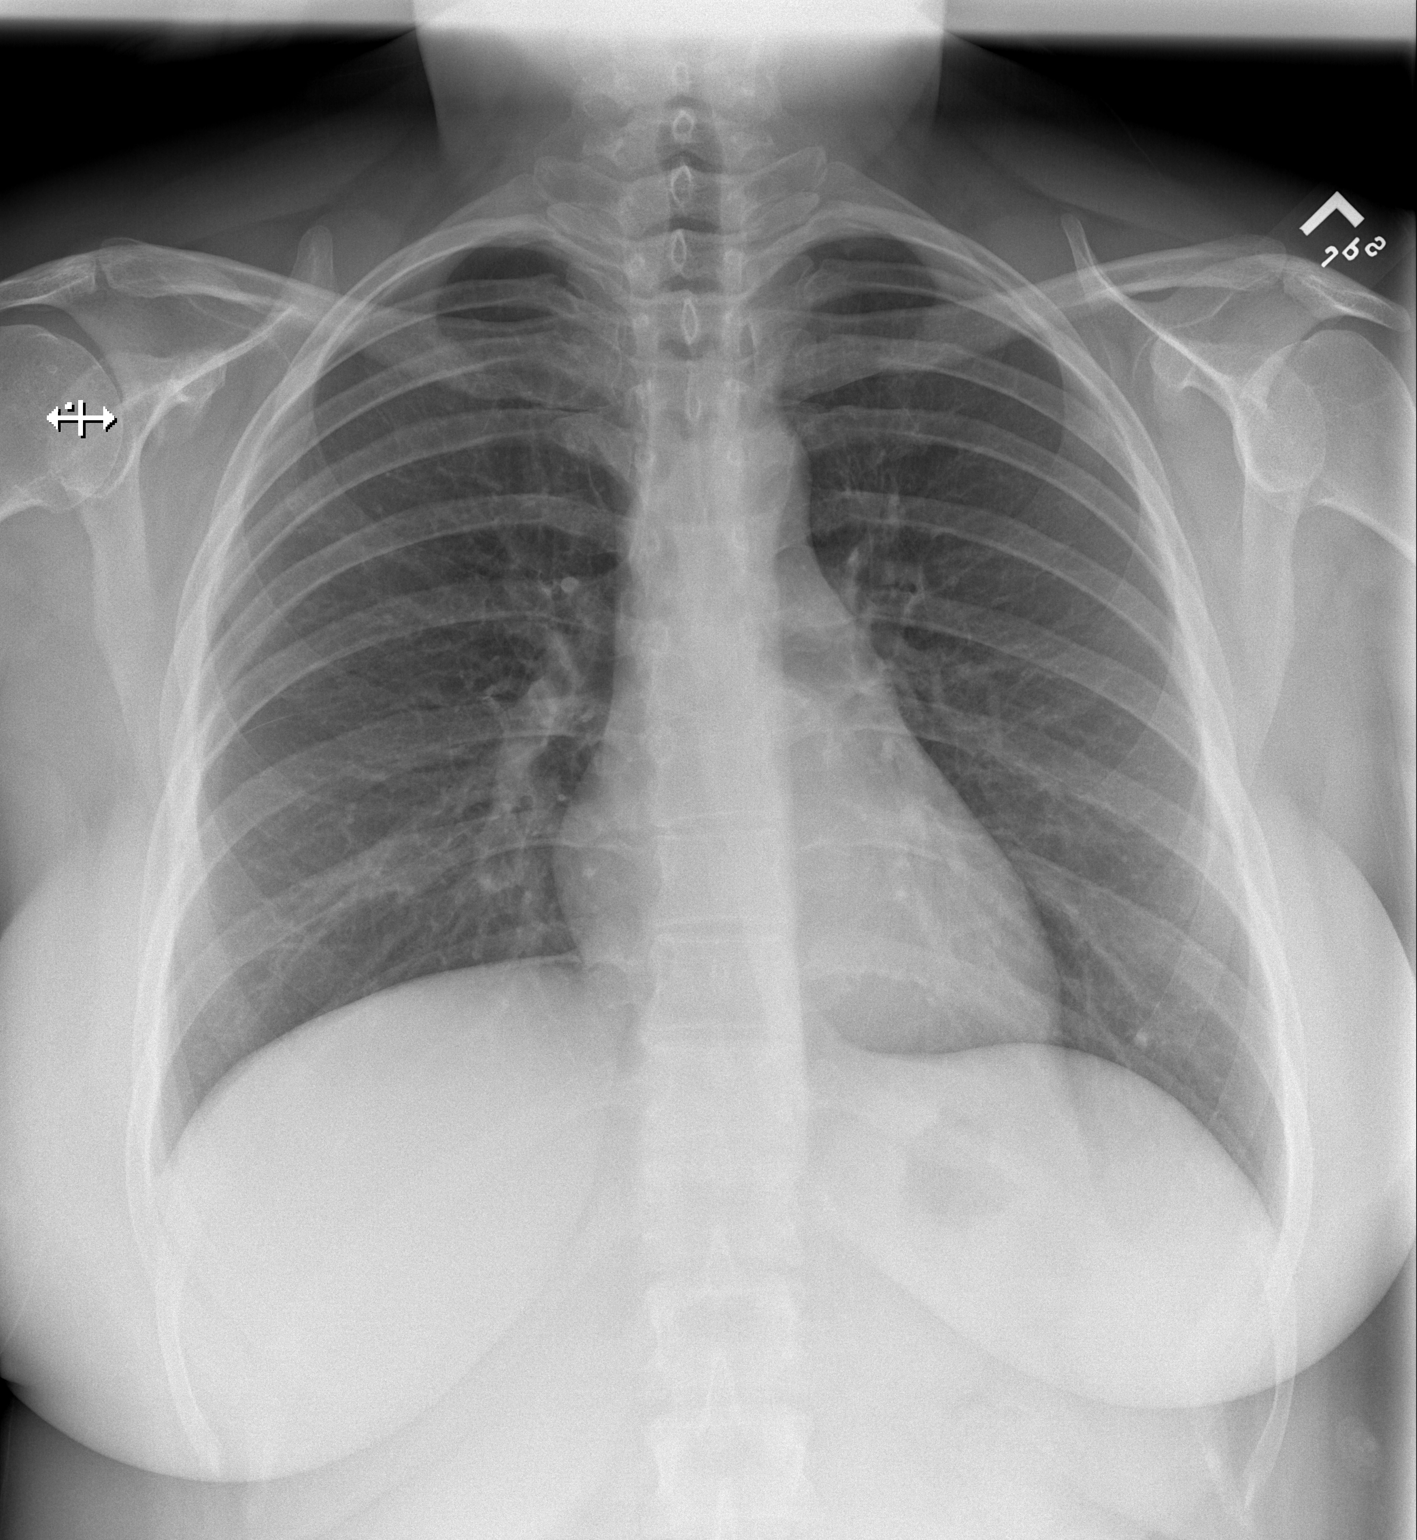

[w chest lat]
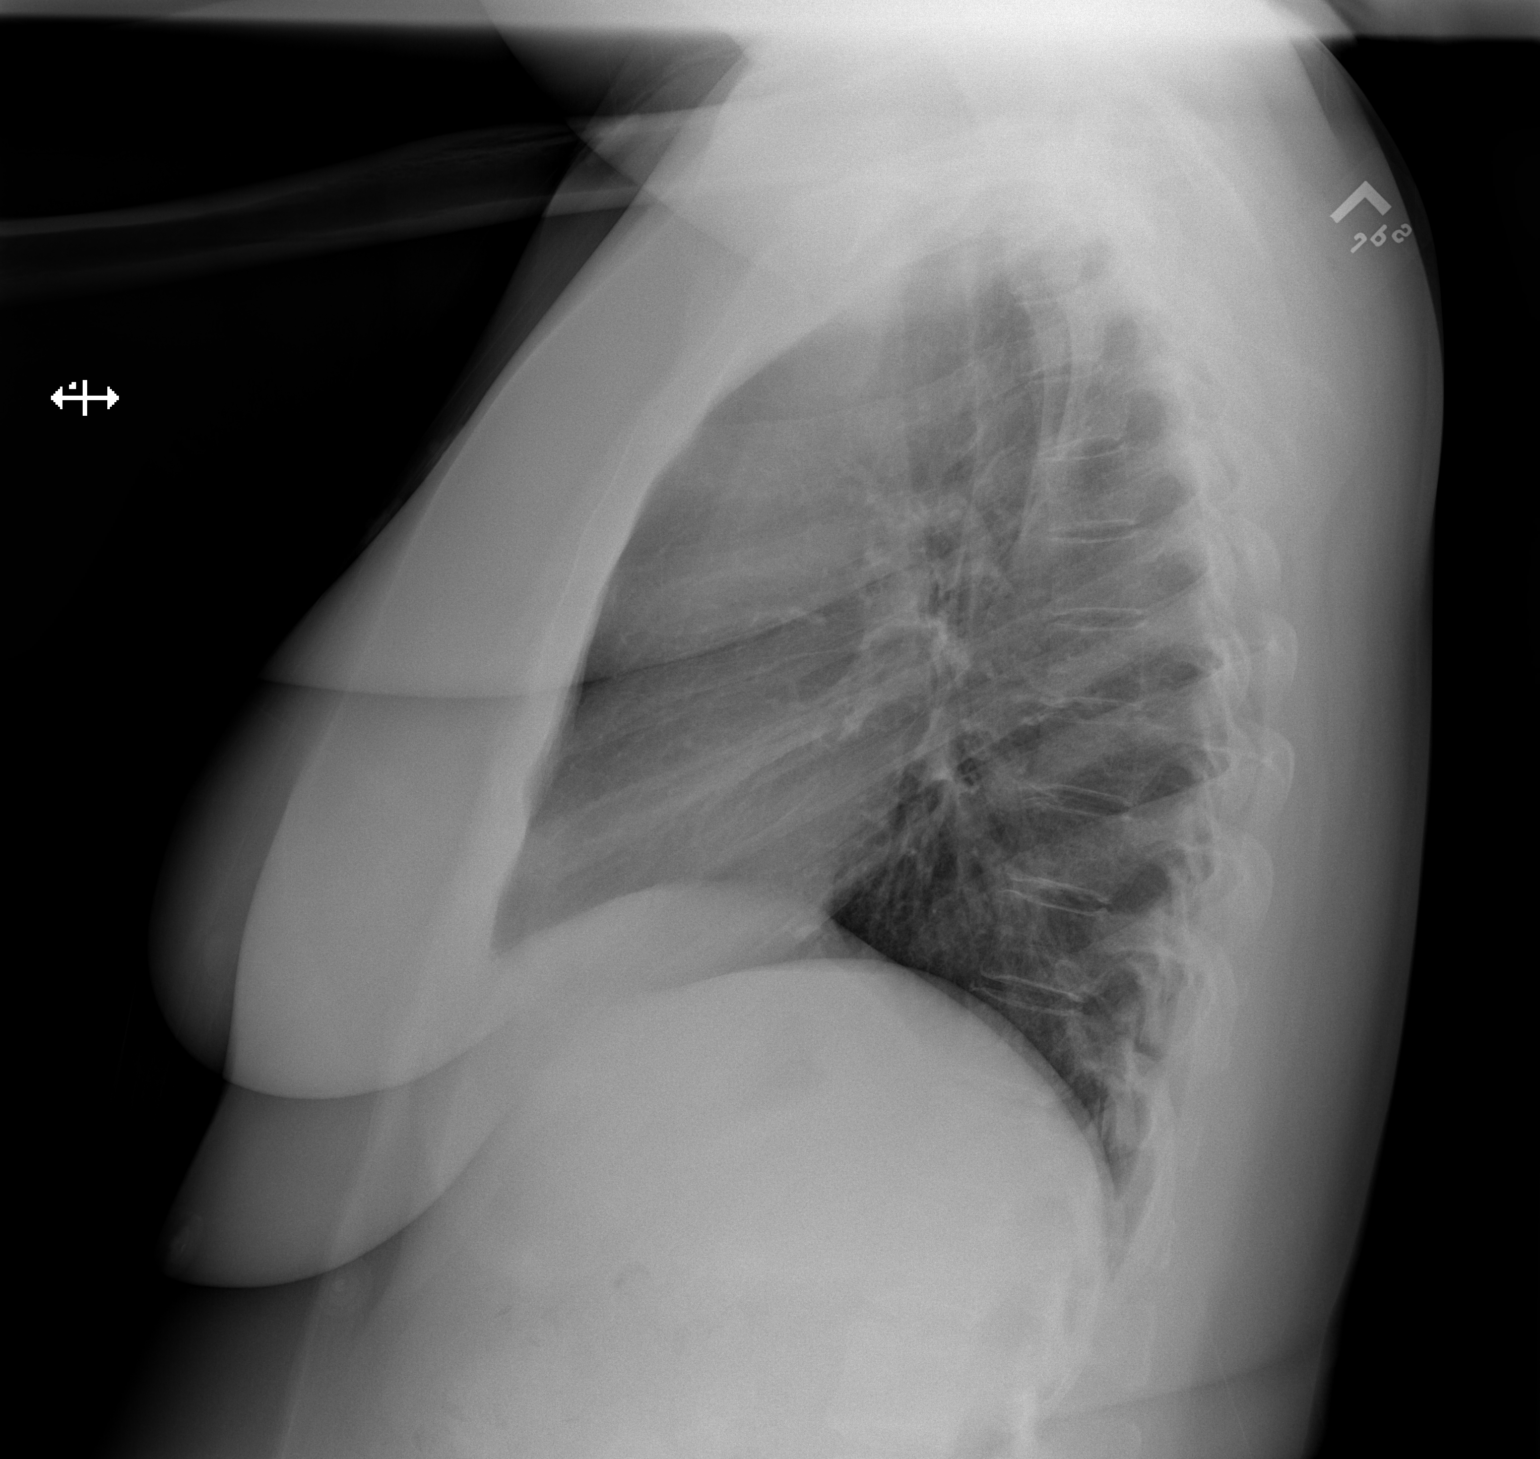

[2 of 2 positions shown; findings below may reference images not displayed]

FINDINGS: Heart size is normal. Mediastinal shadows are normal. The lungs are
clear. No bronchial thickening. No infiltrate, mass, effusion or
collapse. Pulmonary vascularity is normal. No bony abnormality.
IMPRESSION: Normal chest
